# Patient Record
Sex: Female | Born: 1965 | Race: White | Hispanic: No | Marital: Single | Smoking: Current every day smoker
Health system: Southern US, Community
[De-identification: ages and names within clinical notes are randomized; demographics above are authoritative.]

## PROBLEM LIST (undated history)

## (undated) DIAGNOSIS — I10 Essential (primary) hypertension: Secondary | ICD-10-CM

## (undated) DIAGNOSIS — E079 Disorder of thyroid, unspecified: Secondary | ICD-10-CM

## (undated) HISTORY — PX: HERNIA REPAIR: SHX51

## (undated) HISTORY — DX: Disorder of thyroid, unspecified: E07.9

## (undated) HISTORY — DX: Essential (primary) hypertension: I10

## (undated) HISTORY — PX: ABDOMINAL HYSTERECTOMY: SHX81

## (undated) HISTORY — PX: APPENDECTOMY: SHX54

## (undated) HISTORY — PX: CHOLECYSTECTOMY: SHX55

---

## 2014-09-08 ENCOUNTER — Ambulatory Visit: Payer: Self-pay

## 2015-08-08 ENCOUNTER — Emergency Department

## 2015-08-08 ENCOUNTER — Emergency Department
Admission: EM | Admit: 2015-08-08 | Discharge: 2015-08-08 | Disposition: A | Discharge status: Home / Self Care | Attending: Emergency Medicine | Admitting: Emergency Medicine

## 2015-08-08 ENCOUNTER — Encounter: Payer: Self-pay | Admitting: Emergency Medicine

## 2015-08-08 DIAGNOSIS — Z720 Tobacco use: Secondary | ICD-10-CM | POA: Insufficient documentation

## 2015-08-08 DIAGNOSIS — I10 Essential (primary) hypertension: Secondary | ICD-10-CM | POA: Insufficient documentation

## 2015-08-08 DIAGNOSIS — R079 Chest pain, unspecified: Secondary | ICD-10-CM | POA: Insufficient documentation

## 2015-08-08 LAB — CBC
HEMATOCRIT: 38.8 % (ref 35.0–47.0)
HEMOGLOBIN: 13.3 g/dL (ref 12.0–16.0)
MCH: 30.2 pg (ref 26.0–34.0)
MCHC: 34.4 g/dL (ref 32.0–36.0)
MCV: 88 fL (ref 80.0–100.0)
Platelets: 241 10*3/uL (ref 150–440)
RBC: 4.41 MIL/uL (ref 3.80–5.20)
RDW: 12.6 % (ref 11.5–14.5)
WBC: 11.9 10*3/uL — ABNORMAL HIGH (ref 3.6–11.0)

## 2015-08-08 LAB — BASIC METABOLIC PANEL
ANION GAP: 12 (ref 5–15)
BUN: 10 mg/dL (ref 6–20)
CALCIUM: 9.7 mg/dL (ref 8.9–10.3)
CO2: 25 mmol/L (ref 22–32)
Chloride: 101 mmol/L (ref 101–111)
Creatinine, Ser: 1.12 mg/dL — ABNORMAL HIGH (ref 0.44–1.00)
GFR, EST NON AFRICAN AMERICAN: 58 mL/min — AB (ref >60)
Glucose, Bld: 113 mg/dL — ABNORMAL HIGH (ref 65–99)
POTASSIUM: 3.6 mmol/L (ref 3.5–5.1)
Sodium: 138 mmol/L (ref 135–145)

## 2015-08-08 LAB — FIBRIN DERIVATIVES D-DIMER (ARMC ONLY): FIBRIN DERIVATIVES D-DIMER (ARMC): 360 (ref 0–499)

## 2015-08-08 LAB — TROPONIN I: Troponin I: <0.03 ng/mL (ref <0.031)

## 2015-08-08 MED ORDER — GI COCKTAIL ~~LOC~~
30.0000 mL | Freq: Once | ORAL | Status: AC
  Administered 2015-08-08: 30 mL via ORAL
  Filled 2015-08-08: qty 30

## 2015-08-08 MED ORDER — KETOROLAC TROMETHAMINE 60 MG/2ML IM SOLN
60.0000 mg | Freq: Once | INTRAMUSCULAR | Status: AC
  Administered 2015-08-08: 60 mg via INTRAMUSCULAR
  Filled 2015-08-08: qty 2

## 2015-08-08 NOTE — Discharge Instructions (Signed)
Please make a follow-up appointment with your primary care physician or with a [facility] care doctor at [facility].  Please return to the emergency department if you develop chest pain, shortness of breath, palpitations, fainting, fever, or any other symptoms concerning to you.

## 2015-08-08 NOTE — ED Notes (Signed)
Patient ambulatory to triage with steady gait, without difficulty or distress noted; pt reports mid CP radiating into right side chest and arm x hour; denies any accomp symptoms

## 2015-08-08 NOTE — ED Provider Notes (Signed)
[facility] Emergency Department Provider Note  ____________________________________________  Time seen: Approximately 509 AM  I have reviewed the triage vital signs and the nursing notes.   HISTORY  Chief Complaint Chest Pain    HPI Melvenia NeedlesSylvia May Susy ManorObaugh is a 49 y.o. female who comes into the hospital today with chest pain. The patient reports that the pain started around midnight or 1 AM. She reports that she was getting ready for bed when it started. The patient reports that the pain is worse when she lays down and better when she sitting up. The patient reports that she's had some pressure and aching. She denies any shortness of breath or dizziness she is also denying lightheadedness or vomiting. The patient reports it started in her sternum and moved to her right side. The patient denies any pain with deep inspiration has never had this pain before. She reports the pain as a 6-7 out of 10 in intensity. The patient did not take anything for pain but could not tolerate it so she came in for evaluation.   Past medical history Hypertension Hyperlipidemia Thyroid disease Bladder problems GERD  There are no active problems to display for this patient.   Past Surgical History  Procedure Laterality Date  . Hernia repair    . Abdominal hysterectomy    . Cholecystectomy    . Appendectomy      No current outpatient prescriptions on file.  Allergies Review of patient's allergies indicates no known allergies.  No family history on file.  Social History Social History  Substance Use Topics  . Smoking status: Current Every Day Smoker -- 1.00 packs/day    Types: Cigarettes  . Smokeless tobacco: None  . Alcohol Use: No    Review of Systems Constitutional: No fever/chills Eyes: No visual changes. ENT: No sore throat. Cardiovascular:  chest pain. Respiratory: Denies shortness of breath. Gastrointestinal: No abdominal pain.  No nausea, no vomiting.  No  diarrhea.  No constipation. Genitourinary: Negative for dysuria. Musculoskeletal: Negative for back pain. Skin: Negative for rash. Neurological: Negative for headaches, focal weakness or numbness.  10-point ROS otherwise negative.  ____________________________________________   PHYSICAL EXAM:  VITAL SIGNS: ED Triage Vitals  Enc Vitals Group     BP 08/08/15 0322 147/100 mmHg     Pulse Rate 08/08/15 0322 90     Resp 08/08/15 0322 20     Temp 08/08/15 0322 98.1 F (36.7 C)     Temp Source 08/08/15 0322 Oral     SpO2 08/08/15 0322 100 %     Weight 08/08/15 0322 155 lb (70.308 kg)     Height 08/08/15 0322 5\' 3"  (1.6 m)     Head Cir --      Peak Flow --      Pain Score 08/08/15 0318 7     Pain Loc --      Pain Edu? --      Excl. in GC? --     Constitutional: Alert and oriented. Well appearing and in no acute distress. Eyes: Conjunctivae are normal. PERRL. EOMI. Head: Atraumatic. Nose: No congestion/rhinnorhea. Mouth/Throat: Mucous membranes are moist.  Oropharynx non-erythematous. Cardiovascular: Normal rate, regular rhythm. Grossly normal heart sounds.  Good peripheral circulation. Respiratory: Normal respiratory effort.  No retractions. Lungs CTAB. Gastrointestinal: Soft and nontender. No distention. Positive bowel sounds Musculoskeletal: No lower extremity tenderness nor edema.   Neurologic:  Normal speech and language. No gross focal neurologic deficits are appreciated.  Skin:  Skin is warm,  dry and intact. No rash noted. Psychiatric: Mood and affect are normal.   ____________________________________________   LABS (all labs ordered are listed, but only abnormal results are displayed)  Labs Reviewed  BASIC METABOLIC PANEL - Abnormal; Notable for the following:    Glucose, Bld 113 (*)    Creatinine, Ser 1.12 (*)    GFR calc non Af Amer 58 (*)    All other components within normal limits  CBC - Abnormal; Notable for the following:    WBC 11.9 (*)    All other  components within normal limits  TROPONIN I  FIBRIN DERIVATIVES D-DIMER ([facility] ONLY)  TROPONIN I   ____________________________________________  EKG  ED ECG REPORT I, Rebecka Apley, the attending physician, personally viewed and interpreted this ECG.   Date: 08/08/2015  EKG Time: 322  Rate: 93  Rhythm: normal sinus rhythm  Axis: Normal  Intervals:none  ST&T Change: None  ____________________________________________  RADIOLOGY  Chest x-ray: No acute bony process ____________________________________________   PROCEDURES  Procedure(s) performed: None  Critical Care performed: No  ____________________________________________   INITIAL IMPRESSION / ASSESSMENT AND PLAN / ED COURSE  Pertinent labs & imaging results that were available during my care of the patient were reviewed by me and considered in my medical decision making (see chart for details).  This is a 49 year old female who comes in today with chest pain. The patient reports that the pain is hurting in her chest that hurts when she touches her chest. The patient's blood work is unremarkable at this time and her x-ray is negative. I did give the patient a dose of Toradol and a GI cocktail. I will repeat the patient's troponin and reevaluate the patient for pain. The patient's care was signed out to Dr. Sharma Covert who will follow-up the results of the troponin and disposition the patient. ____________________________________________   FINAL CLINICAL IMPRESSION(S) / ED DIAGNOSES  Final diagnoses:  Chest pain, unspecified chest pain type      Rebecka Apley, MD 08/08/15 [phone]

## 2016-03-24 IMAGING — CR DG CHEST 2V
2 series · 2 of 2 positions shown · non-contrast
Comparison: None.

CLINICAL DATA: Mid chest pain radiating to the right side. Onset 1
hour prior.

EXAM:
CHEST  2 VIEW

[chest pa]
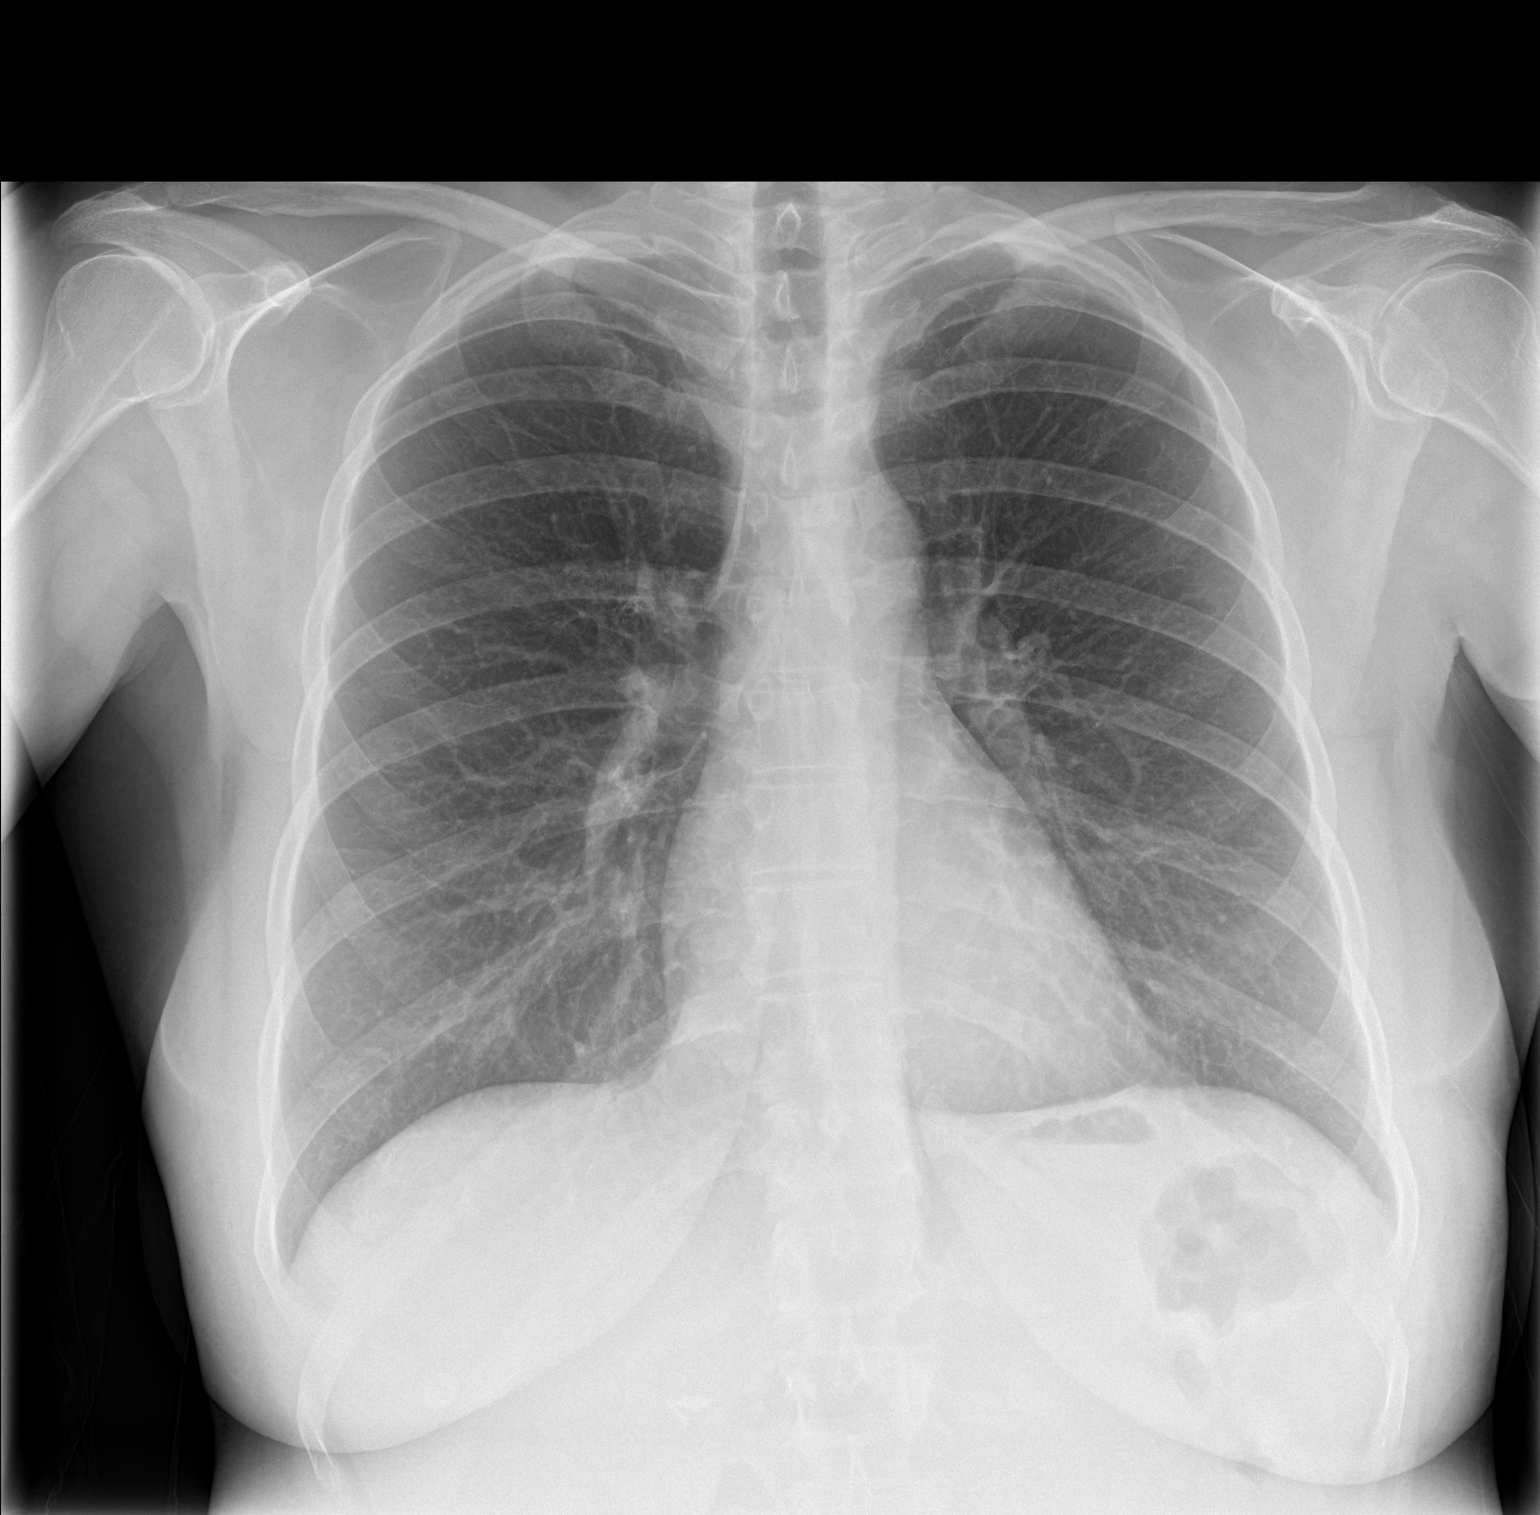

[chest lat]
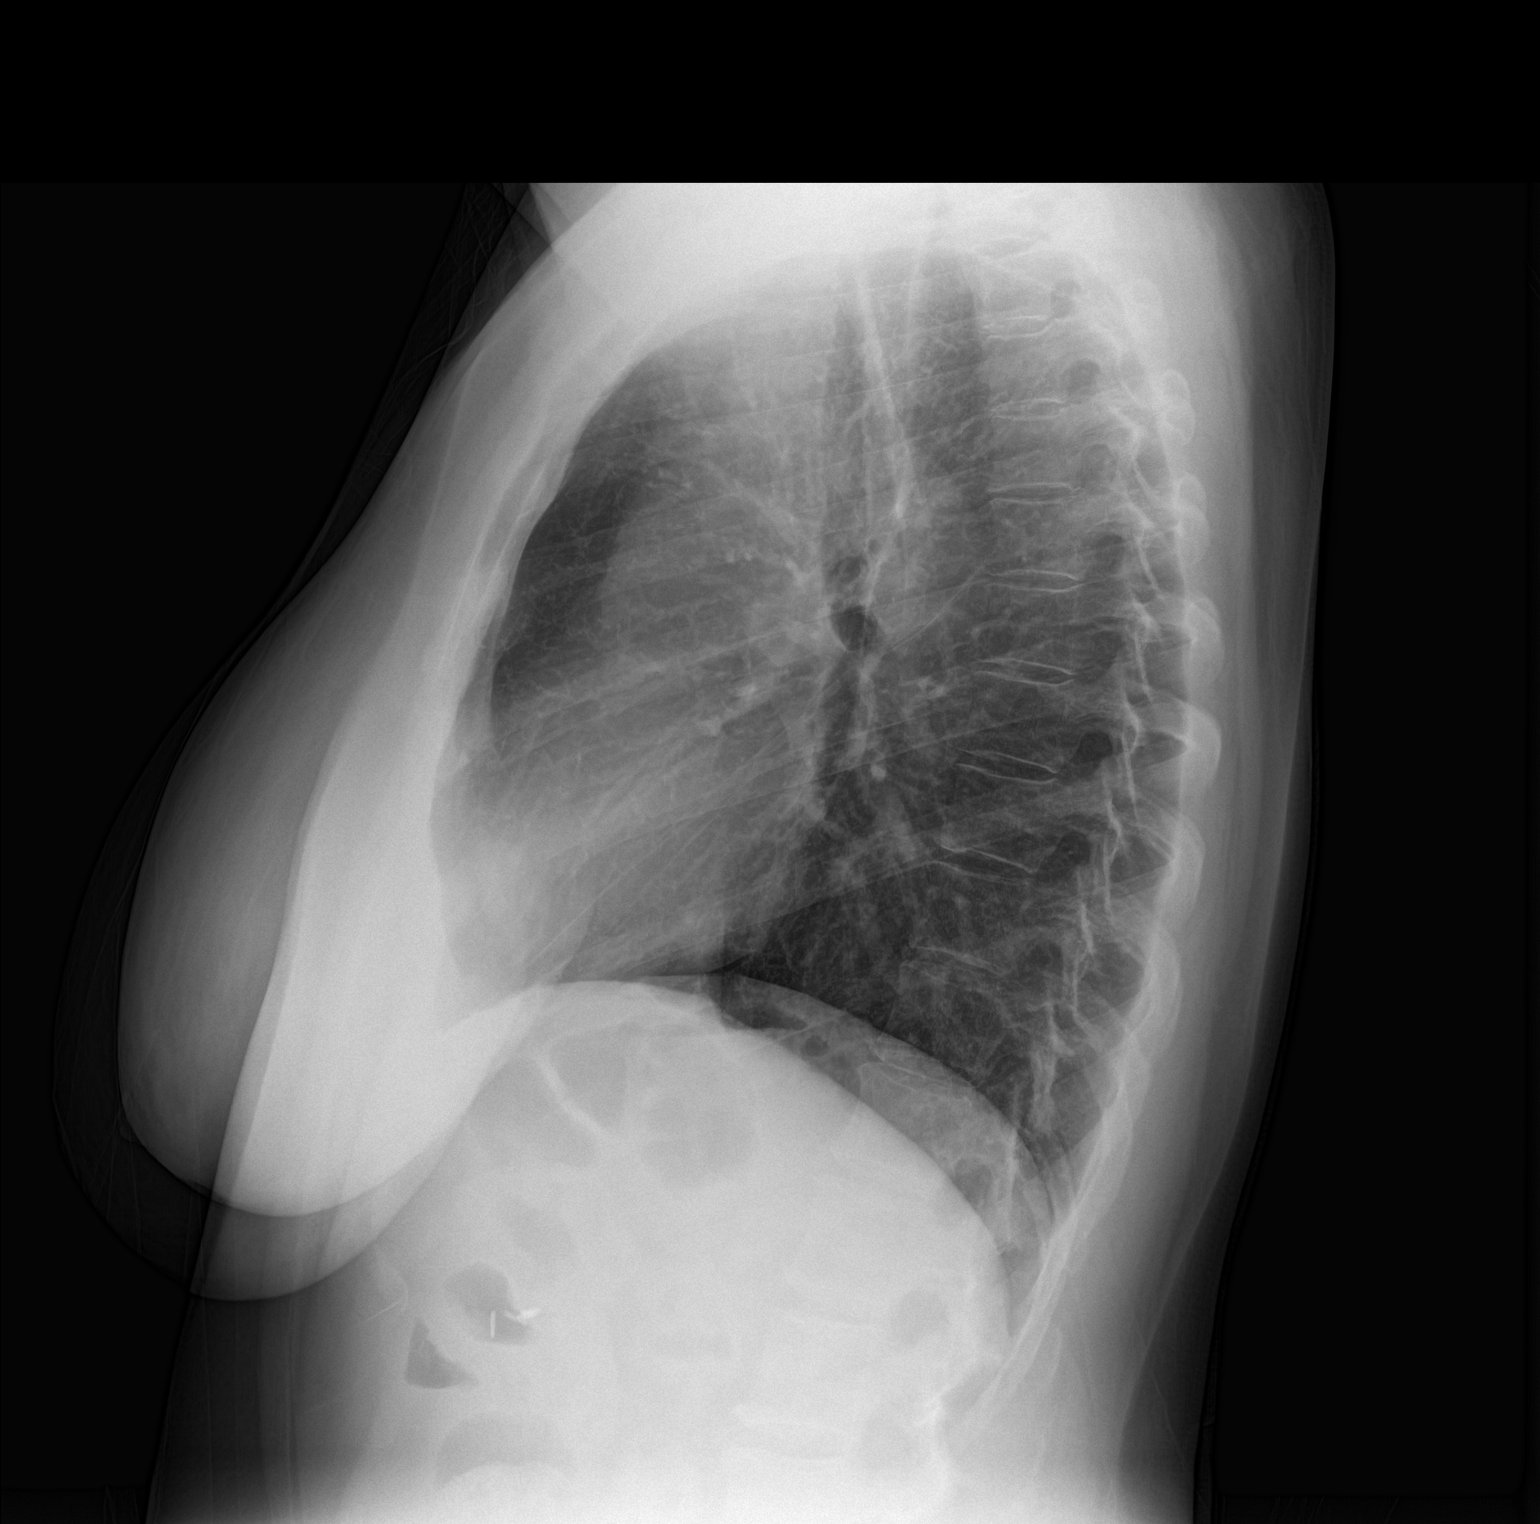

[2 of 2 positions shown; findings below may reference images not displayed]

FINDINGS: The cardiomediastinal contours are normal. The lungs are clear.
Pulmonary vasculature is normal. No consolidation, pleural effusion,
or pneumothorax. No acute osseous abnormalities are seen.
IMPRESSION: No acute pulmonary process.

## 2023-07-21 ENCOUNTER — Ambulatory Visit
Admission: EM | Admit: 2023-07-21 | Discharge: 2023-07-21 | Disposition: A | Discharge status: Home / Self Care | Payer: No Typology Code available for payment source

## 2023-07-21 DIAGNOSIS — N898 Other specified noninflammatory disorders of vagina: Secondary | ICD-10-CM | POA: Insufficient documentation

## 2023-07-21 DIAGNOSIS — R300 Dysuria: Secondary | ICD-10-CM | POA: Diagnosis present

## 2023-07-21 LAB — POCT URINALYSIS DIP (MANUAL ENTRY)
Bilirubin, UA: NEGATIVE
Blood, UA: NEGATIVE
Glucose, UA: NEGATIVE mg/dL
Ketones, POC UA: NEGATIVE mg/dL
Leukocytes, UA: NEGATIVE
Nitrite, UA: NEGATIVE
Protein Ur, POC: NEGATIVE mg/dL
Spec Grav, UA: <=1.005 — AB (ref 1.010–1.025)
Urobilinogen, UA: 0.2 U/dL
pH, UA: 6 (ref 5.0–8.0)

## 2023-07-21 MED ORDER — FLUCONAZOLE 150 MG PO TABS: 150.0000 mg | ORAL_TABLET | Freq: Every day | ORAL | 0 refills | Status: AC

## 2023-07-21 NOTE — ED Provider Notes (Signed)
Renaldo Fiddler    CSN: [id number] Arrival date & time: 07/21/23  1346      History   Chief Complaint Chief Complaint  Patient presents with   Vaginal Discharge    HPI Rhonda Rose is a 57 y.o. female.  Patient presents with 3 day history of vaginal itching and irritation.  Her symptoms started after taking an antibiotic for a sinus infection.  She states this is similar to previous episodes of yeast infection.  She denies fever, abdominal pain, hematuria, vaginal discharge, pelvic pain, or other symptoms.  No treatment attempted.  Her medical history includes hypertension, hyperlipidemia, thyroid disease, GERD, degenerative disc disease.  The history is provided by the patient and medical records.    Past Medical History:  Diagnosis Date   Hypertension    Thyroid disease     There are no problems to display for this patient.   Past Surgical History:  Procedure Laterality Date   ABDOMINAL HYSTERECTOMY     APPENDECTOMY     CHOLECYSTECTOMY     HERNIA REPAIR      OB History   No obstetric history on file.      Home Medications    Prior to Admission medications   Medication Sig Start Date End Date Taking? Authorizing Provider  amLODipine (NORVASC) 10 MG tablet Take by mouth. 07/07/23  Yes [provider]  atorvastatin (LIPITOR) 80 MG tablet Take by mouth. 01/25/23  Yes [provider]  carvedilol (COREG) 6.25 MG tablet TAKE ONE TABLET BY MOUTH TWO TIMES A DAY FOR HIGH BLOOD PRESSURE REPLACES AMLODIPINE 10/22/22 04/08/24 Yes [provider]  estrogens, conjugated, (PREMARIN) 0.3 MG tablet Take by mouth. 04/22/23  Yes [provider]  fluconazole (DIFLUCAN) 150 MG tablet Take 1 tablet (150 mg total) by mouth daily. Take one tablet today.  May repeat in 3 days. 07/21/23  Yes Mickie Bail, NP  fluticasone (FLONASE) 50 MCG/ACT nasal spray INSTILL 2 SPRAYS INTO NOSE EVERY DAY AS NEEDED FOR ALLERGY RELIEF 01/22/23 01/23/24 Yes [provider]  Calcium Carb-Cholecalciferol (CALCIUM 600 + D PO) Take 1 tablet by mouth 2 (two) times daily.    [provider]  cetirizine (ZYRTEC) 10 MG tablet Take 10 mg by mouth daily.    [provider]  esomeprazole (NEXIUM) 40 MG capsule Take 40 mg by mouth daily at 12 noon.    [provider]  estrogens conjugated, synthetic A, (CENESTIN) 0.9 MG tablet Take 0.9 mg by mouth daily.    [provider]  furosemide (LASIX) 20 MG tablet Take 20 mg by mouth daily.    [provider]  HYDROcodone-acetaminophen (NORCO/VICODIN) 5-325 MG tablet Take 1 tablet by mouth every 4 (four) hours as needed for moderate pain.    [provider]  levothyroxine (SYNTHROID, LEVOTHROID) 100 MCG tablet Take 100 mcg by mouth daily before breakfast.    [provider]  lisinopril (PRINIVIL,ZESTRIL) 40 MG tablet Take 20 mg by mouth daily.    [provider]  meloxicam (MOBIC) 15 MG tablet Take 15 mg by mouth daily.    [provider]  oxybutynin (DITROPAN) 5 MG tablet Take 15 mg by mouth at bedtime.    [provider]  simvastatin (ZOCOR) 40 MG tablet Take 20 mg by mouth daily at 6 PM.    [provider]    Family History History reviewed. No pertinent family history.  Social History Social History   Tobacco Use  Smoking status: Every Day    Current packs/day: 1.00    Types: Cigarettes  Substance Use Topics   Alcohol use: No     Allergies   Duloxetine and Hydrochlorothiazide   Review of Systems Review of Systems  Constitutional:  Negative for chills and fever.  Gastrointestinal:  Negative for abdominal pain, diarrhea and vomiting.  Genitourinary:  Positive for dysuria. Negative for flank pain, frequency, hematuria, pelvic pain and vaginal discharge.       Vaginal itching  Skin:  Negative for color change and rash.     Physical Exam Triage Vital Signs ED Triage Vitals  Encounter Vitals Group      BP      Systolic BP Percentile      Diastolic BP Percentile      Pulse      Resp      Temp      Temp src      SpO2      Weight      Height      Head Circumference      Peak Flow      Pain Score      Pain Loc      Pain Education      Exclude from Growth Chart    No data found.  Updated Vital Signs BP 125/83   Pulse (!) 101   Temp 98.3 F (36.8 C)   Resp 20   SpO2 97%   Visual Acuity Right Eye Distance:   Left Eye Distance:   Bilateral Distance:    Right Eye Near:   Left Eye Near:    Bilateral Near:     Physical Exam Vitals and nursing note reviewed.  Constitutional:      General: She is not in acute distress.    Appearance: She is well-developed.  HENT:     Mouth/Throat:     Mouth: Mucous membranes are moist.  Cardiovascular:     Rate and Rhythm: Normal rate and regular rhythm.     Heart sounds: Normal heart sounds.  Pulmonary:     Effort: Pulmonary effort is normal. No respiratory distress.     Breath sounds: Normal breath sounds.  Abdominal:     General: Bowel sounds are normal.     Palpations: Abdomen is soft.     Tenderness: There is no abdominal tenderness. There is no right CVA tenderness, left CVA tenderness, guarding or rebound.  Musculoskeletal:     Cervical back: Neck supple.  Skin:    General: Skin is warm and dry.  Neurological:     Mental Status: She is alert.  Psychiatric:        Mood and Affect: Mood normal.        Behavior: Behavior normal.      UC Treatments / Results  Labs (all labs ordered are listed, but only abnormal results are displayed) Labs Reviewed  POCT URINALYSIS DIP (MANUAL ENTRY) - Abnormal; Notable for the following components:      Result Value   Color, UA light yellow (*)    Spec Grav, UA <=1.005 (*)    All other components within normal limits  CERVICOVAGINAL ANCILLARY ONLY    EKG   Radiology No results found.  Procedures Procedures (including critical care time)  Medications Ordered in  UC Medications - No data to display  Initial Impression / Assessment and Plan / UC Course  I have reviewed the triage vital signs and the nursing notes.  Pertinent labs &  imaging results that were available during my care of the patient were reviewed by me and considered in my medical decision making (see chart for details).    Vaginal itching and irritation, dysuria.  Patient's symptoms started after taking an antibiotic and are similar to previous episodes of yeast infection.  She obtained vaginal self swab for testing.  Treating with Diflucan.  Discussed that we will call if test results are positive.  Discussed that she may require additional treatment at that time.  Instructed her to follow-up with her PCP or gynecologist if her symptoms are not improving.  Patient agrees to plan of care.   Final Clinical Impressions(s) / UC Diagnoses   Final diagnoses:  Vaginal itching  Vaginal irritation  Dysuria     Discharge Instructions      Take the Diflucan as directed.  Follow-up with your primary care provider if your symptoms are not improving.      ED Prescriptions     Medication Sig Dispense Auth. Provider   fluconazole (DIFLUCAN) 150 MG tablet Take 1 tablet (150 mg total) by mouth daily. Take one tablet today.  May repeat in 3 days. 2 tablet Mickie Bail, NP      PDMP not reviewed this encounter.   Mickie Bail, NP 07/21/23 [phone]

## 2023-07-21 NOTE — ED Triage Notes (Signed)
Patient to Urgent Care with complaints of vaginal burning and itching that started three days ago after completing a course of Augmentin for a sinus infection.

## 2023-07-21 NOTE — Discharge Instructions (Addendum)
Take the Diflucan as directed.  Follow-up with your primary care provider if your symptoms are not improving.   Test results completed on visit today showed you are positive for bacterial vaginosis and yeast  At the visit today you were prescribed Diflucan by the provider the you were seen in via telephone you were prescribed metronidazole

## 2023-07-22 LAB — CERVICOVAGINAL ANCILLARY ONLY
Bacterial Vaginitis (gardnerella): POSITIVE — AB
Candida Glabrata: NEGATIVE
Candida Vaginitis: POSITIVE — AB
Chlamydia: NEGATIVE
Comment: NEGATIVE
Comment: NEGATIVE
Comment: NEGATIVE
Comment: NEGATIVE
Comment: NEGATIVE
Comment: NORMAL
Neisseria Gonorrhea: NEGATIVE
Trichomonas: NEGATIVE

## 2023-07-23 ENCOUNTER — Telehealth: Payer: Self-pay

## 2023-07-23 MED ORDER — METRONIDAZOLE 500 MG PO TABS: 500.0000 mg | ORAL_TABLET | Freq: Two times a day (BID) | ORAL | 0 refills | Status: AC

## 2023-07-23 NOTE — Telephone Encounter (Signed)
Per protocol, pt requires tx with metronidazole. Reviewed with patient, verified pharmacy, prescription sent.

## 2023-07-24 ENCOUNTER — Telehealth: Payer: Self-pay | Admitting: Emergency Medicine

## 2023-07-24 NOTE — Telephone Encounter (Signed)
Patient presented in clinic requesting that her visit and all lab results completed on 07/21/2023 be faxed to her doctor at the [facility] clinic, discussed with patient that urgent care clinic does not do this, instructed patient that she may receive her information from medical records and take her paperwork to her doctor if they are unable to see chart via electronic records, patient requesting that her visit summary and all lab work be printed out, discussed with patient that we are unable to do so, discussed with patient that she is able to activate her MyChart and print out her own records and fax them to her doctor if needed, patient states " I am a patient of the [place] and I am not going to activate a MyChart account", discussed with patient that I am unable reprint her discharge summary from visit on 07/21/2023 with positive test results and treatment given, completed and patient has also been given the telephone number to medical records, unsatisfied
